# Patient Record
Sex: Male | Born: 1990 | Race: Black or African American | Hispanic: No | Marital: Single | Smoking: Former smoker
Health system: Southern US, Community
[De-identification: ages and names within clinical notes are randomized; demographics above are authoritative.]

---

## 2019-04-06 ENCOUNTER — Emergency Department
Admission: EM | Admit: 2019-04-06 | Discharge: 2019-04-06 | Disposition: A | Discharge status: Home / Self Care | Payer: 59 | Attending: Student in an Organized Health Care Education/Training Program | Admitting: Student in an Organized Health Care Education/Training Program

## 2019-04-06 ENCOUNTER — Other Ambulatory Visit: Payer: Self-pay

## 2019-04-06 ENCOUNTER — Encounter: Payer: Self-pay | Admitting: *Deleted

## 2019-04-06 ENCOUNTER — Emergency Department: Payer: 59

## 2019-04-06 DIAGNOSIS — F17200 Nicotine dependence, unspecified, uncomplicated: Secondary | ICD-10-CM | POA: Diagnosis not present

## 2019-04-06 DIAGNOSIS — F1210 Cannabis abuse, uncomplicated: Secondary | ICD-10-CM | POA: Insufficient documentation

## 2019-04-06 DIAGNOSIS — R0789 Other chest pain: Secondary | ICD-10-CM | POA: Insufficient documentation

## 2019-04-06 LAB — COMPREHENSIVE METABOLIC PANEL
ALT: 15 U/L (ref 0–44)
AST: 21 U/L (ref 15–41)
Albumin: 4.5 g/dL (ref 3.5–5.0)
Alkaline Phosphatase: 44 U/L (ref 38–126)
Anion gap: 9 (ref 5–15)
BUN: 15 mg/dL (ref 6–20)
CO2: 25 mmol/L (ref 22–32)
Calcium: 9.5 mg/dL (ref 8.9–10.3)
Chloride: 104 mmol/L (ref 98–111)
Creatinine, Ser: 1.06 mg/dL (ref 0.61–1.24)
GFR calc Af Amer: >60 mL/min (ref >60)
GFR calc non Af Amer: >60 mL/min (ref >60)
Glucose, Bld: 102 mg/dL — ABNORMAL HIGH (ref 70–99)
Potassium: 4.5 mmol/L (ref 3.5–5.1)
Sodium: 138 mmol/L (ref 135–145)
Total Bilirubin: 0.8 mg/dL (ref 0.3–1.2)
Total Protein: 7.6 g/dL (ref 6.5–8.1)

## 2019-04-06 LAB — CBC WITH DIFFERENTIAL/PLATELET
Abs Immature Granulocytes: 0.01 10*3/uL (ref 0.00–0.07)
Basophils Absolute: 0.1 10*3/uL (ref 0.0–0.1)
Basophils Relative: 2 %
Eosinophils Absolute: 0.2 10*3/uL (ref 0.0–0.5)
Eosinophils Relative: 3 %
HCT: 47.6 % (ref 39.0–52.0)
Hemoglobin: 15.6 g/dL (ref 13.0–17.0)
Immature Granulocytes: 0 %
Lymphocytes Relative: 46 %
Lymphs Abs: 2.5 10*3/uL (ref 0.7–4.0)
MCH: 28.6 pg (ref 26.0–34.0)
MCHC: 32.8 g/dL (ref 30.0–36.0)
MCV: 87.2 fL (ref 80.0–100.0)
Monocytes Absolute: 0.6 10*3/uL (ref 0.1–1.0)
Monocytes Relative: 11 %
Neutro Abs: 2.1 10*3/uL (ref 1.7–7.7)
Neutrophils Relative %: 38 %
Platelets: 279 10*3/uL (ref 150–400)
RBC: 5.46 MIL/uL (ref 4.22–5.81)
RDW: 13.3 % (ref 11.5–15.5)
WBC: 5.5 10*3/uL (ref 4.0–10.5)
nRBC: 0 % (ref 0.0–0.2)

## 2019-04-06 LAB — TROPONIN I: Troponin I: <0.03 ng/mL (ref <0.03)

## 2019-04-06 MED ORDER — CYCLOBENZAPRINE HCL 5 MG PO TABS: 5.0000 mg | ORAL_TABLET | Freq: Three times a day (TID) | ORAL | 0 refills | Status: DC | PRN

## 2019-04-06 MED ORDER — LORAZEPAM 2 MG/ML IJ SOLN
1.0000 mg | Freq: Once | INTRAMUSCULAR | Status: AC
  Administered 2019-04-06: 11:00:00 1 mg via INTRAVENOUS
  Filled 2019-04-06: qty 1

## 2019-04-06 NOTE — ED Notes (Signed)
Provider at bedside

## 2019-04-06 NOTE — ED Provider Notes (Signed)
[facility] Emergency Department Provider Note ____________________________________________  Time seen: 1023  I have reviewed the triage vital signs and the nursing notes.  HISTORY  Chief Complaint  Chest Pain  HPI Bruce Davis is a 28 y.o. male presents to the ED via EMS from home.  Patient reports substernal chest pain that has been intermittent since 4 days prior.  He describes onset while at work Wednesday.  He denies any injury preceding the onset of his symptoms.  He describes that the off & on pressure in the substernal region lasts about 12 minutes in duration.  Pain is aggravated by movement of his head and neck.  The pain is also aggravated by movement of his chest and torso.  He denies any nausea, vomiting, dizziness, diaphoresis, syncope, or weakness.  Patient denies taking any medications for symptom relief.  He denies any significant medical history and takes no daily medications.  He does admit to being in a regular marijuana smoker.  He otherwise denies any sick contacts, recent travel, or other high risk exposures.  He is a Programmer, applications as a Designer, industrial/product.  History reviewed. No pertinent past medical history.  There are no active problems to display for this patient.  History reviewed. No pertinent surgical history.  Prior to Admission medications   Medication Sig Start Date End Date Taking? Authorizing Provider  cyclobenzaprine (FLEXERIL) 5 MG tablet Take 1 tablet (5 mg total) by mouth 3 (three) times daily as needed for muscle spasms. 04/06/19   Willy Eddy, MD    Allergies Patient has no allergy information on record.  No family history on file.  Social History Social History   Tobacco Use  . Smoking status: Current Every Day Smoker  . Smokeless tobacco: Never Used  Substance Use Topics  . Alcohol use: Yes    Frequency: Never  . Drug use: Not on file    Review of Systems  Constitutional: Negative for fever. Eyes:  Negative for visual changes. ENT: Negative for sore throat. Cardiovascular: Positive for chest pain. Respiratory: Negative for shortness of breath. Gastrointestinal: Negative for abdominal pain, vomiting and diarrhea. Genitourinary: Negative for dysuria. Musculoskeletal: Negative for back pain. Skin: Negative for rash. Neurological: Negative for headaches, focal weakness or numbness. ____________________________________________  PHYSICAL EXAM:  VITAL SIGNS: ED Triage Vitals  Enc Vitals Group     BP --      Pulse Rate 04/06/19 1027 79     Resp 04/06/19 1027 (!) 22     Temp 04/06/19 1027 98.3 F (36.8 C)     Temp Source 04/06/19 1027 Oral     SpO2 04/06/19 1027 98 %     Weight 04/06/19 1023 185 lb (83.9 kg)     Height 04/06/19 1023 6\' 1"  (1.854 m)     Head Circumference --      Peak Flow --      Pain Score 04/06/19 1022 10     Pain Loc --      Pain Edu? --      Excl. in GC? --     Constitutional: Alert and oriented. Well appearing and in no distress. Patient is able to provide history, but is intermittently writhing on the bed while speaking in complete, coherent sentences. Head: Normocephalic and atraumatic. Eyes: Conjunctivae are normal. Normal extraocular movements Neck: Supple. Normal ROM without crepitus Hematological/Lymphatic/Immunological: No cervical lymphadenopathy. Cardiovascular: Normal rate, regular rhythm. Normal distal pulses. No murmurs, rubs, or gallops.  Respiratory: Normal respiratory effort. No wheezes/rales/rhonchi.  Gastrointestinal: Soft and nontender. No distention. Musculoskeletal: Nontender with normal range of motion in all extremities.  Neurologic:  Normal gait without ataxia. Normal speech and language. No gross focal neurologic deficits are appreciated. Skin:  Skin is warm, dry and intact. No rash noted. Psychiatric: Mood and affect are normal. Patient exhibits appropriate insight and judgment. ____________________________________________    LABS (pertinent positives/negatives) Labs Reviewed  COMPREHENSIVE METABOLIC PANEL - Abnormal; Notable for the following components:      Result Value   Glucose, Bld 102 (*)    All other components within normal limits  TROPONIN I  CBC WITH DIFFERENTIAL/PLATELET  ____________________________________________  EKG  NSR 73 bpm PR Interval 164 ms QRS Duration 87 ms Normal axis No STEMI ____________________________________________   RADIOLOGY  CXR negative ____________________________________________  PROCEDURES  Procedures Ativan 1 mg IVP ____________________________________________  INITIAL IMPRESSION / ASSESSMENT AND PLAN / ED COURSE  Bruce Davis was evaluated in Emergency Department on 04/06/2019 for the symptoms described in the history of present illness. He was evaluated in the context of the global COVID-19 pandemic, which necessitated consideration that the patient might be at risk for infection with the SARS-CoV-2 virus that causes COVID-19. Institutional protocols and algorithms that pertain to the evaluation of patients at risk for COVID-19 are in a state of rapid change based on information released by regulatory bodies including the CDC and federal and state organizations. These policies and algorithms were followed during the patient's care in the ED.  Differential diagnosis includes, but is not limited to, ACS, aortic dissection, pulmonary embolism, cardiac tamponade, pneumothorax, pneumonia, pericarditis, myocarditis, GI-related causes including esophagitis/gastritis, and musculoskeletal chest wall pain.    Patient with ED evaluation of intermittent history of 4 days of substernal chest pain. The patient has reproducible pain to the anterior central chest. His labs, CXR, EKG are reassuring. HEART score of 1 and PERC negative. He has been given IV Ativan in the ED with improvement of symptoms. He will be discharged with instructions to follow-up with his  PCP. ____________________________________________  FINAL CLINICAL IMPRESSION(S) / ED DIAGNOSES  Final diagnoses:  Chest wall pain      Bruce Davis, Charlesetta IvoryJenise V Bacon, PA-C 04/06/19 1648    Willy Eddyobinson, Patrick, MD 04/10/19 [phone]

## 2019-04-06 NOTE — ED Triage Notes (Signed)
Pt to ED reporting chest pain x 3 days that has been worsened since waking this morning. No known injury. Pt able to moves arms and shoulders but reports increased pain with movement. SOB reported, no dizziness or lightheadedness, nausea or vomiting.

## 2019-10-11 IMAGING — DX PORTABLE CHEST - 1 VIEW
1 series · 1 of 1 positions shown · non-contrast
Comparison: None.

CLINICAL DATA: Chest pain for the past 3 days.  No known injury.

EXAM:
PORTABLE CHEST 1 VIEW

[chest ap]
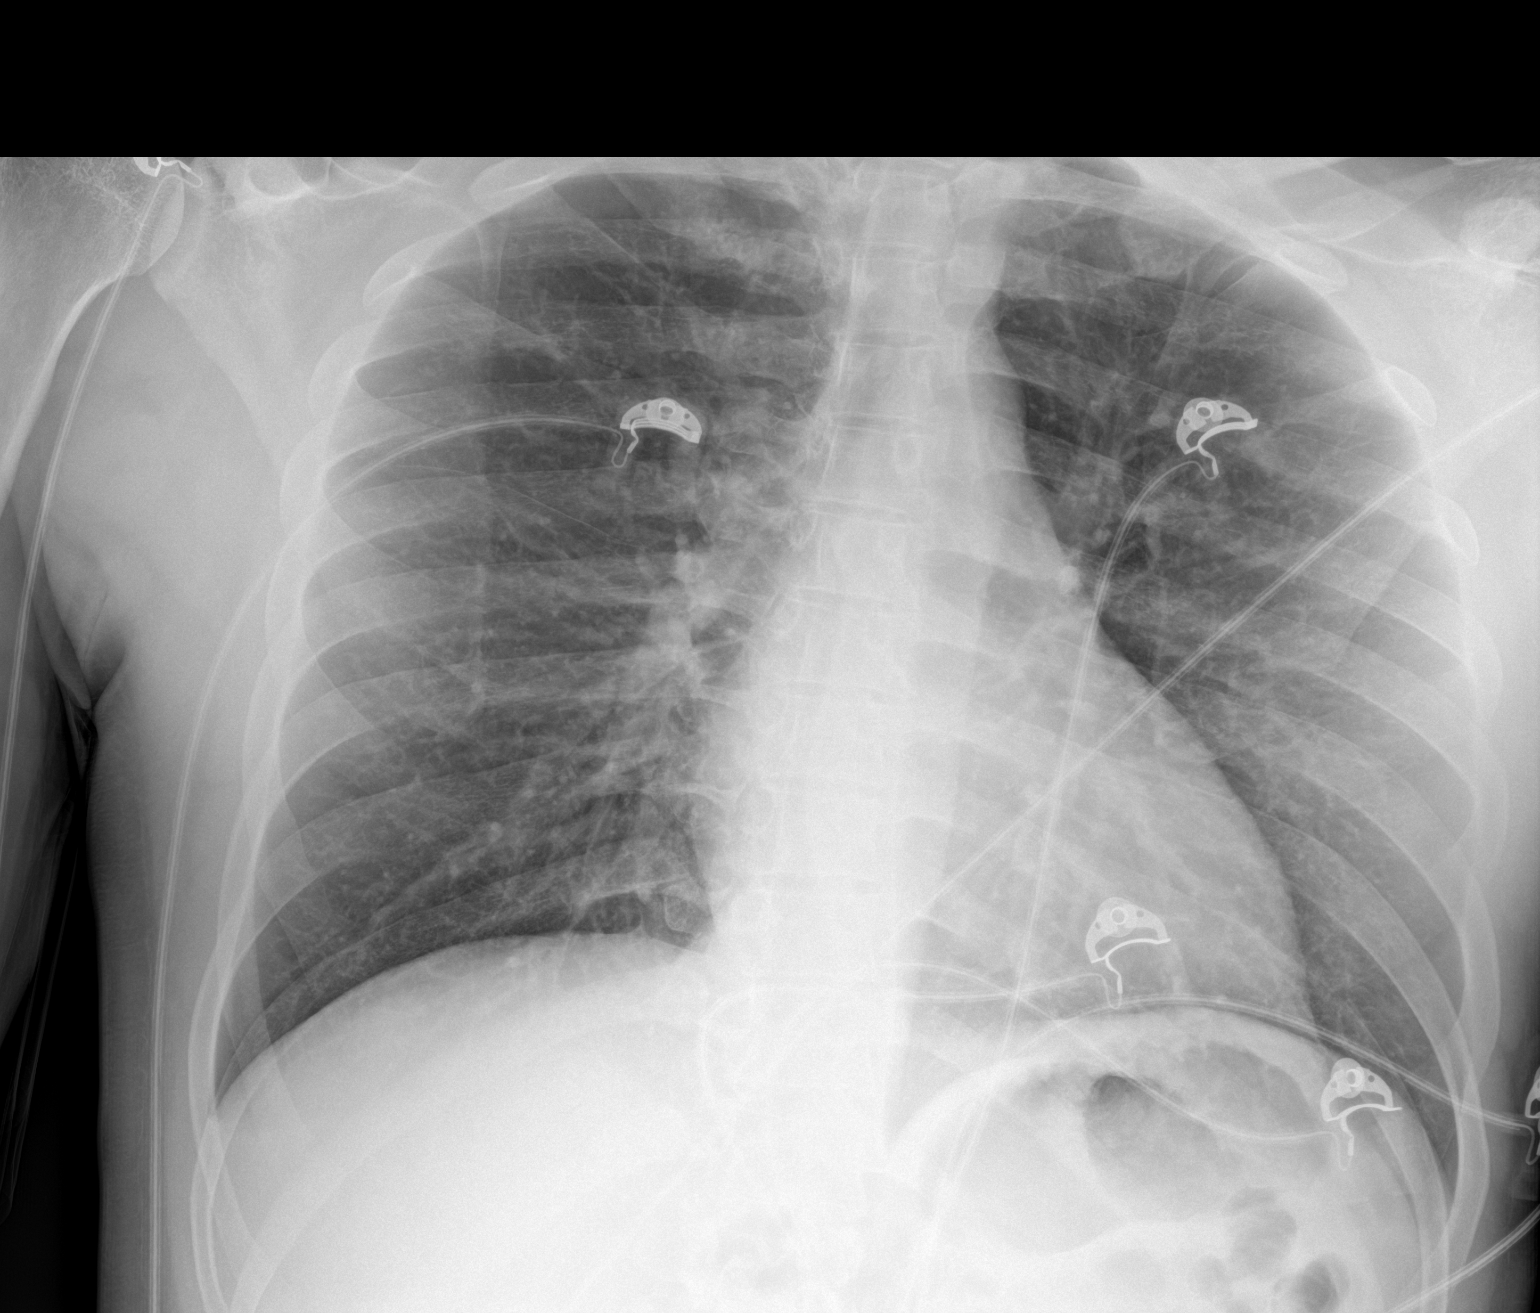

[1 of 1 positions shown; findings below may reference images not displayed]

FINDINGS: Normal cardiac silhouette and mediastinal contours given AP
projection. No discrete focal airspace opacities. No pleural
effusion or pneumothorax. No evidence of edema. No acute osseous
abnormalities.
IMPRESSION: No acute cardiopulmonary disease on this AP portable examination.
Further evaluation with a PA and lateral chest radiograph may be
obtained as clinically indicated.

## 2023-05-19 ENCOUNTER — Ambulatory Visit
Admission: EM | Admit: 2023-05-19 | Discharge: 2023-05-19 | Disposition: A | Discharge status: Home / Self Care | Payer: Medicaid Other | Attending: Family Medicine | Admitting: Family Medicine

## 2023-05-19 ENCOUNTER — Ambulatory Visit (INDEPENDENT_AMBULATORY_CARE_PROVIDER_SITE_OTHER): Payer: Medicaid Other

## 2023-05-19 DIAGNOSIS — L03011 Cellulitis of right finger: Secondary | ICD-10-CM | POA: Diagnosis not present

## 2023-05-19 DIAGNOSIS — S6991XA Unspecified injury of right wrist, hand and finger(s), initial encounter: Secondary | ICD-10-CM | POA: Diagnosis not present

## 2023-05-19 MED ORDER — AMOXICILLIN-POT CLAVULANATE 875-125 MG PO TABS: 1.0000 | ORAL_TABLET | Freq: Two times a day (BID) | ORAL | 0 refills | Status: AC

## 2023-05-19 NOTE — ED Triage Notes (Addendum)
Pt was moving 3 days ago the base of a bed fell on his right index finger. Swelling, pain, difficulty moving digit. Also notes purulent drainage around nailbed

## 2023-05-19 NOTE — Discharge Instructions (Signed)
Take the Augmentin twice daily with food for 10 days.  Soak your finger in warm water and Epsom salts 2-3 times a day to help facilitate drainage.  Keep a dressing on your finger until the drainage has stopped.  You can use over-the-counter Tylenol and ibuprofen according to the package instructions as needed for pain.  Return for reevaluation if you develop any increased redness, swelling, drainage, or red streaks going up your finger, or fever.  

## 2023-05-19 NOTE — ED Provider Notes (Signed)
[facility]    CSN: [id number] Arrival date & time: 05/19/23  2952      History   Chief Complaint Chief Complaint  Patient presents with   Finger Injury    HPI Bruce Davis is a 32 y.o. male.   HPI  32 year old male with no significant past medical history plus for evaluation of crush injury to his right index finger.  He reports that the base of the bed fell on his finger 3 days ago.  In the last day he has noticed redness, swelling, burning pain, and pus drainage from around the cuticle.  He denies any fever.  History reviewed. No pertinent past medical history.  There are no problems to display for this patient.   History reviewed. No pertinent surgical history.     Home Medications    Prior to Admission medications   Medication Sig Start Date End Date Taking? Authorizing Provider  amoxicillin-clavulanate (AUGMENTIN) 875-125 MG tablet Take 1 tablet by mouth every 12 (twelve) hours for 10 days. 05/19/23 05/29/23 Yes Becky Augusta, NP    Family History History reviewed. No pertinent family history.  Social History Social History   Tobacco Use   Smoking status: Former    Types: Cigarettes   Smokeless tobacco: Never  Vaping Use   Vaping Use: Never used  Substance Use Topics   Alcohol use: Yes    Comment: social   Drug use: Not Currently     Allergies   Patient has no known allergies.   Review of Systems Review of Systems  Constitutional:  Negative for fever.  Musculoskeletal:  Positive for myalgias.       Pain, swelling, and redness to the soft tissue around the cuticle of the right index finger.  Skin:  Positive for color change.       Redness around cuticle right index finger.  Neurological:  Negative for numbness.     Physical Exam Triage Vital Signs ED Triage Vitals  Enc Vitals Group     BP 05/19/23 0910 126/77     Pulse Rate 05/19/23 0910 69     Resp 05/19/23 0910 16     Temp 05/19/23 0910 98.1 F (36.7 C)     Temp  Source 05/19/23 0910 Oral     SpO2 05/19/23 0910 100 %     Weight 05/19/23 0907 180 lb (81.6 kg)     Height 05/19/23 0907 5\' 11"  (1.803 m)     Head Circumference --      Peak Flow --      Pain Score 05/19/23 0906 7     Pain Loc --      Pain Edu? --      Excl. in GC? --    No data found.  Updated Vital Signs BP 126/77 (BP Location: Left Arm)   Pulse 69   Temp 98.1 F (36.7 C) (Oral)   Resp 16   Ht 5\' 11"  (1.803 m)   Wt 180 lb (81.6 kg)   SpO2 100%   BMI 25.10 kg/m   Visual Acuity Right Eye Distance:   Left Eye Distance:   Bilateral Distance:    Right Eye Near:   Left Eye Near:    Bilateral Near:     Physical Exam Vitals and nursing note reviewed.  Constitutional:      Appearance: Normal appearance. He is not ill-appearing.  HENT:     Head: Normocephalic and atraumatic.  Musculoskeletal:        General:  Swelling, tenderness and signs of injury present. No deformity. Normal range of motion.  Skin:    General: Skin is warm and dry.     Capillary Refill: Capillary refill takes less than 2 seconds.     Findings: Erythema present.  Neurological:     General: No focal deficit present.     Mental Status: He is alert and oriented to person, place, and time.  Psychiatric:        Mood and Affect: Mood normal.        Behavior: Behavior normal.        Thought Content: Thought content normal.        Judgment: Judgment normal.      UC Treatments / Results  Labs (all labs ordered are listed, but only abnormal results are displayed) Labs Reviewed - No data to display  EKG   Radiology DG Hand Complete Right  Result Date: 05/19/2023 CLINICAL DATA:  Right hand pain and swelling after blunt trauma 3 days ago. EXAM: RIGHT HAND - COMPLETE 3+ VIEW COMPARISON:  None Available. FINDINGS: There is no evidence of fracture or dislocation. There is no evidence of arthropathy or other focal bone abnormality. Soft tissues are unremarkable. IMPRESSION: Negative. Electronically  Signed   By: Elberta Fortis M.D.   On: 05/19/2023 09:32    Procedures Procedures (including critical care time)  Medications Ordered in UC Medications - No data to display  Initial Impression / Assessment and Plan / UC Course  I have reviewed the triage vital signs and the nursing notes.  Pertinent labs & imaging results that were available during my care of the patient were reviewed by me and considered in my medical decision making (see chart for details).   Patient is a very pleasant, nontoxic-appearing 32 year old male presenting for evaluation of crush injury to right eccentrics finger along with redness, swelling, and pus drainage around the cuticle.  Injury started 3 days ago and questions and redness started approximately 1 day ago.  He denies any fever.  He does report he has difficulty wiggling his fingers secondary to swelling and pain.  He has full sensation in his fingertip.  X-ray was obtained at triage due to the crush injury.  Right hand films independently reviewed and evaluated by me.  Pression: No evidence of fracture or dislocation to the right index finger.  There is soft tissue swelling present on the x-rays.  Radiology overread is pending. Radiology impression states there is evidence of fracture or dislocation.  Patient does have a pus pocket forming around the medial aspect of the cuticle of the right index finger with some overlying erythema and fluctuance.     I offered the patient I&D which she agreed to.  I cleansed the wound with alcohol and anesthetized using 0.4 mL of 1% lidocaine without epi.  Once good anesthesia was achieved I cleansed the finger with chlorhexidine and performed a stab incision which produced approximately 1 mL of serosanguineous drainage.  The wound was again cleansed with chlorhexidine and dressed with a Band-Aid.  I will discharge patient on the diagnosis of paronychia and right index finger injury.  I will place him on Augmentin 875 mg twice  daily for 10 days for treatment of his paronychia.  The patient does chew his nails.  I have also advised him to keep a dressing in place until the drainage stops and to soak his finger in warm water Epsom salts 2-3 times a day.  Work note provided.  Final Clinical Impressions(s) / UC Diagnoses   Final diagnoses:  Paronychia of finger of right hand  Injury of finger of right hand, initial encounter     Discharge Instructions      Take the Augmentin twice daily with food for 10 days.  Soak your finger in warm water and Epsom salts 2-3 times a day to help facilitate drainage.  Keep a dressing on your finger until the drainage has stopped.  You can use over-the-counter Tylenol and ibuprofen according to the package instructions as needed for pain.  Return for reevaluation if you develop any increased redness, swelling, drainage, or red streaks going up your finger, or fever.      ED Prescriptions     Medication Sig Dispense Auth. Provider   amoxicillin-clavulanate (AUGMENTIN) 875-125 MG tablet Take 1 tablet by mouth every 12 (twelve) hours for 10 days. 20 tablet Becky Augusta, NP      PDMP not reviewed this encounter.   Becky Augusta, NP 05/19/23 [phone]
